# Patient Record
Sex: Female | Born: 1970 | Race: Black or African American | Hispanic: No | Marital: Married
Health system: Southern US, Community
[De-identification: ages and names within clinical notes are randomized; demographics above are authoritative.]

---

## 2020-02-25 ENCOUNTER — Emergency Department (HOSPITAL_COMMUNITY)
Admission: EM | Admit: 2020-02-25 | Discharge: 2020-02-26 | Disposition: A | Discharge status: Home / Self Care | Payer: 59 | Attending: Emergency Medicine | Admitting: Emergency Medicine

## 2020-02-25 ENCOUNTER — Other Ambulatory Visit: Payer: Self-pay

## 2020-02-25 ENCOUNTER — Emergency Department (HOSPITAL_COMMUNITY): Payer: 59

## 2020-02-25 DIAGNOSIS — R1032 Left lower quadrant pain: Secondary | ICD-10-CM | POA: Diagnosis present

## 2020-02-25 DIAGNOSIS — K529 Noninfective gastroenteritis and colitis, unspecified: Secondary | ICD-10-CM | POA: Diagnosis not present

## 2020-02-25 LAB — LIPASE, BLOOD: Lipase: 18 U/L (ref 11–51)

## 2020-02-25 LAB — COMPREHENSIVE METABOLIC PANEL
ALT: 11 U/L (ref 0–44)
AST: 16 U/L (ref 15–41)
Albumin: 4.3 g/dL (ref 3.5–5.0)
Alkaline Phosphatase: 62 U/L (ref 38–126)
Anion gap: 10 (ref 5–15)
BUN: 7 mg/dL (ref 6–20)
CO2: 25 mmol/L (ref 22–32)
Calcium: 9.8 mg/dL (ref 8.9–10.3)
Chloride: 105 mmol/L (ref 98–111)
Creatinine, Ser: 0.92 mg/dL (ref 0.44–1.00)
GFR calc Af Amer: >60 mL/min (ref >60)
GFR calc non Af Amer: >60 mL/min (ref >60)
Glucose, Bld: 116 mg/dL — ABNORMAL HIGH (ref 70–99)
Potassium: 3.7 mmol/L (ref 3.5–5.1)
Sodium: 140 mmol/L (ref 135–145)
Total Bilirubin: 0.4 mg/dL (ref 0.3–1.2)
Total Protein: 7.5 g/dL (ref 6.5–8.1)

## 2020-02-25 LAB — CBC
HCT: 44.5 % (ref 36.0–46.0)
Hemoglobin: 14 g/dL (ref 12.0–15.0)
MCH: 29.4 pg (ref 26.0–34.0)
MCHC: 31.5 g/dL (ref 30.0–36.0)
MCV: 93.5 fL (ref 80.0–100.0)
Platelets: 331 10*3/uL (ref 150–400)
RBC: 4.76 MIL/uL (ref 3.87–5.11)
RDW: 13 % (ref 11.5–15.5)
WBC: 10.8 10*3/uL — ABNORMAL HIGH (ref 4.0–10.5)
nRBC: 0 % (ref 0.0–0.2)

## 2020-02-25 MED ORDER — LACTATED RINGERS IV BOLUS
1000.0000 mL | Freq: Once | INTRAVENOUS | Status: AC
  Administered 2020-02-25: 1000 mL via INTRAVENOUS

## 2020-02-25 MED ORDER — SODIUM CHLORIDE 0.9% FLUSH: 3.0000 mL | Freq: Once | INTRAVENOUS | Status: DC

## 2020-02-25 MED ORDER — DIPHENHYDRAMINE HCL 50 MG/ML IJ SOLN
25.0000 mg | Freq: Once | INTRAMUSCULAR | Status: AC
  Administered 2020-02-25: 25 mg via INTRAVENOUS
  Filled 2020-02-25: qty 1

## 2020-02-25 MED ORDER — IOHEXOL 300 MG/ML  SOLN
100.0000 mL | Freq: Once | INTRAMUSCULAR | Status: AC | PRN
  Administered 2020-02-25: 100 mL via INTRAVENOUS

## 2020-02-25 MED ORDER — ONDANSETRON HCL 4 MG/2ML IJ SOLN: 4.0000 mg | Freq: Once | INTRAMUSCULAR | Status: DC

## 2020-02-25 MED ORDER — DROPERIDOL 2.5 MG/ML IJ SOLN
1.2500 mg | Freq: Once | INTRAMUSCULAR | Status: AC
  Administered 2020-02-25: 1.25 mg via INTRAVENOUS
  Filled 2020-02-25: qty 2

## 2020-02-25 MED ORDER — DICYCLOMINE HCL 10 MG PO CAPS
10.0000 mg | ORAL_CAPSULE | Freq: Once | ORAL | Status: AC
  Administered 2020-02-25: 10 mg via ORAL
  Filled 2020-02-25: qty 1

## 2020-02-25 MED ORDER — PROMETHAZINE HCL 25 MG PO TABS
25.0000 mg | ORAL_TABLET | Freq: Three times a day (TID) | ORAL | 0 refills | Status: AC | PRN
Start: 2020-02-25 — End: ?

## 2020-02-25 NOTE — ED Notes (Signed)
Pt returned from CT °

## 2020-02-25 NOTE — ED Provider Notes (Signed)
[facility] EMERGENCY DEPARTMENT Provider Note   CSN: [id number] Arrival date & time: 02/25/20  1222     History Chief Complaint  Patient presents with  . Abdominal Pain  . Diarrhea    Alexandra Brown is a 49 y.o. female.  The history is provided by the patient and medical records. No language interpreter was used.  Abdominal Pain Associated symptoms: diarrhea   Diarrhea Associated symptoms: abdominal pain    Alexandra Brown is a 49 y.o. female who presents to the Emergency Department complaining of abdominal pain and diarrhea. She presents the emergency department complaining of abdominal pain, vomiting and diarrhea that started about two days ago. She lives in [place] and has specialist therefore lupus and colitis. She is currently on medications for chronic pain as well as bentyl and promethazine PRN. She is in the region visiting her family and caring for her grandchildren. Two days ago she developed cramping left lower quadrant abdominal pain with associated nausea. Yesterday she developed numerous episodes of emesis and numerous episodes of diarrhea. No reports of fevers, dysuria. She states that this is similar to prior flareups of her episodes. She states that she is hospitalized about six times a year for recurrent episodes. Her usual hospitalization consists of IV fluids, antiemetics and pain medications. 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 She does not smoke, drink alcohol, use drugs.    No past medical history on file. Lupus, colitis  There are no problems to display for this patient.      OB History   No obstetric history on file.     No family history on file.  Social History   Tobacco Use  . Smoking status: Not on file  Substance Use Topics  . Alcohol use: Not on file  . Drug use: Not on file    Home Medications Prior to Admission medications   Not on File    Allergies    Patient has no allergy information on  record.  Review of Systems   Review of Systems  Gastrointestinal: Positive for abdominal pain and diarrhea.  All other systems reviewed and are negative.   Physical Exam Updated Vital Signs BP (!) 113/59 (BP Location: Right Arm)   Pulse 96   Temp 99.3 F (37.4 C) (Oral)   Resp 18   Ht 5\' 2"  (1.575 m)   Wt 59 kg   SpO2 100%   BMI 23.78 kg/m   Physical Exam Vitals and nursing note reviewed.  Constitutional:      Appearance: She is well-developed.  HENT:     Head: Normocephalic and atraumatic.  Cardiovascular:     Rate and Rhythm: Normal rate and regular rhythm.     Heart sounds: No murmur heard.   Pulmonary:     Effort: Pulmonary effort is normal. No respiratory distress.     Breath sounds: Normal breath sounds.  Abdominal:     Palpations: Abdomen is soft.     Tenderness: There is abdominal tenderness. There is no guarding or rebound.     Comments: Mild to moderate generalized abdominal tenderness  Musculoskeletal:        General: No tenderness.  Skin:    General: Skin is warm and dry.  Neurological:     Mental Status: She is alert and oriented to person, place, and time.  Psychiatric:        Behavior: Behavior normal.     ED Results / Procedures / Treatments   Labs (  all labs ordered are listed, but only abnormal results are displayed) Labs Reviewed  COMPREHENSIVE METABOLIC PANEL - Abnormal; Notable for the following components:      Result Value   Glucose, Bld 116 (*)    All other components within normal limits  CBC - Abnormal; Notable for the following components:   WBC 10.8 (*)    All other components within normal limits  LIPASE, BLOOD  URINALYSIS, ROUTINE W REFLEX MICROSCOPIC    EKG None  Radiology No results found.  Procedures Procedures (including critical care time)  Medications Ordered in ED Medications  sodium chloride flush (NS) 0.9 % injection 3 mL (has no administration in time range)  lactated ringers bolus 1,000 mL (has no  administration in time range)  dicyclomine (BENTYL) capsule 10 mg (has no administration in time range)  droperidol (INAPSINE) 2.5 MG/ML injection 1.25 mg (has no administration in time range)    ED Course  I have reviewed the triage vital signs and the nursing notes.  Pertinent labs & imaging results that were available during my care of the patient were reviewed by me and considered in my medical decision making (see chart for details).    MDM Rules/Calculators/A&P                         Patient with history of colitis here for evaluation of abdominal pain, vomiting and diarrhea similar to prior failures of her colitis. She does have tenderness without peritoneal findings on initial evaluation. Labs with mild leukocytosis. A CT abdomen pelvis was obtained, which demonstrates findings consistent with colitis.  Records reviewed in care everywhere. Patient does have a history of chronic pain syndrome as well as recurrent colitis with unclear source. Upon multiple evaluations it has been found that colitis is not due to an infectious source. Given her history of recurrent C diff, will not start antibiotics. Patient unable to provide stool sample at this time, making acute C diff infection unlikely. CT does demonstrate some bladder under distention. She has no current urinary symptoms. She did urinate prior to being able to provide a urine sample in the department, feel UTI is unlikely at this point in time she is asymptomatic.  Following treatment with medications in the department and on reassessment she is feeling significantly improved. Plan to discharge home with oral antiemetics, clear liquid diet as well as outpatient follow-up and return precautions.  Final Clinical Impression(s) / ED Diagnoses Final diagnoses:  Colitis    Rx / DC Orders ED Discharge Orders    None       Tilden Fossa, MD 02/26/20 0003

## 2020-02-25 NOTE — ED Triage Notes (Signed)
Patient arrives to ED with complaints of LLQ abdominal pain, vomitng and mucous looking diarrhea for the past 2 days. Patient states she has hx of colitis and this feels the same. Patient is visiting and is from [place].

## 2020-02-25 NOTE — Discharge Instructions (Addendum)
Drink plenty of fluids to stay hydrated. Get rechecked immediately if you develop fevers, worsening symptoms or inability to tolerate oral fluids.  You had a CT scan performed in the emergency department today. The CT scan showed findings of colitis as well as a cyst on your liver and spleen. Please follow-up with your gastroenterologist for repeat evaluation and to see if you need any further imaging.

## 2020-02-25 NOTE — ED Notes (Signed)
Pt to CT via stretcher

## 2020-02-26 NOTE — ED Notes (Signed)
Pt verbalized understanding of d/c instructions, follow up and medications. Pt to WR via WC, NAD.  

## 2020-10-29 IMAGING — CT CT ABD-PELV W/ CM
2 of 5 series · 14 of 46 positions shown, 16 images · IV contrast (APPLIED)
Comparison: None

CLINICAL DATA: History of colitis, appendectomy now with left lower
quadrant abdominal pain vomiting and diarrhea

EXAM:
CT ABDOMEN AND PELVIS WITH CONTRAST
TECHNIQUE: Multidetector CT imaging of the abdomen and pelvis was performed
using the standard protocol following bolus administration of
intravenous contrast.
CONTRAST:  100mL OMNIPAQUE IOHEXOL 300 MG/ML  SOLN

[Series 3: abdomen 5.0 · axial · 0.70mm/px · z∈[-391,+9]mm · 11 of 94 slices shown, 13 images]
[im 7/94  soft-tissue]
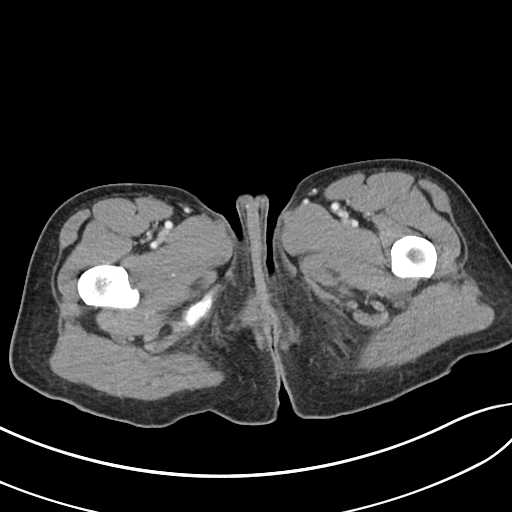
[im 7/94  bone]
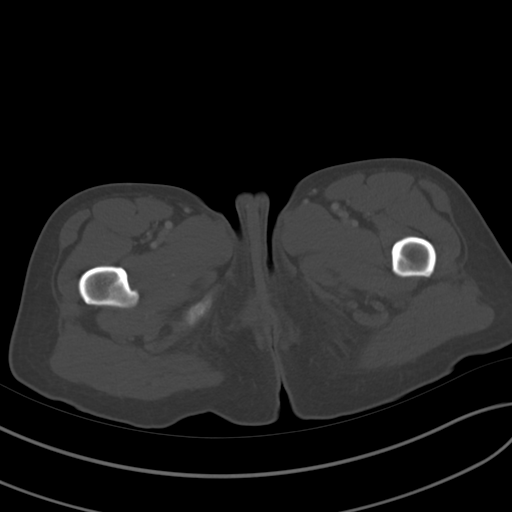
[im 13/94  soft-tissue]
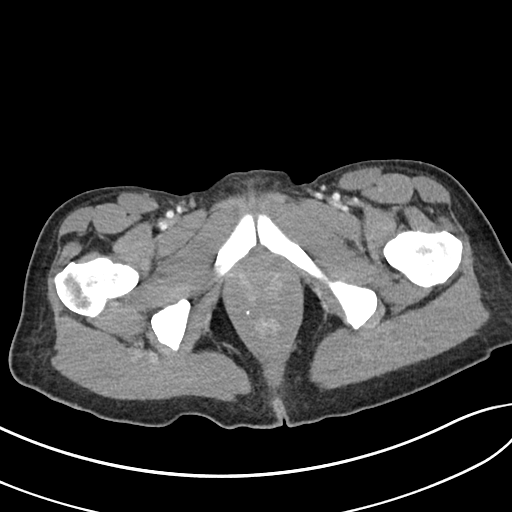
[im 25/94  soft-tissue]
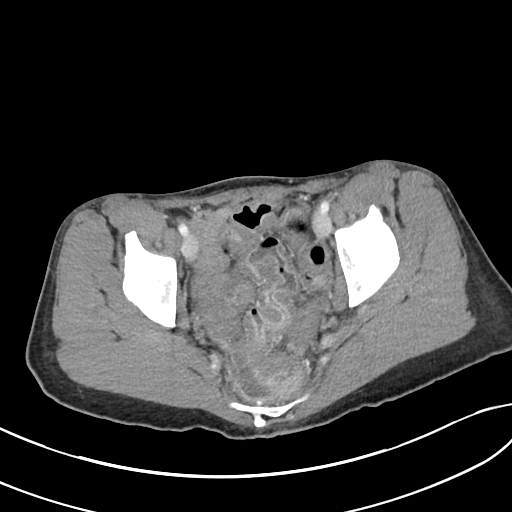
[im 32/94  soft-tissue]
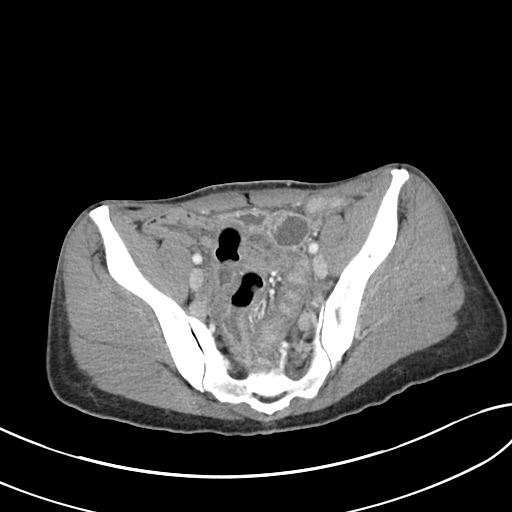
[im 38/94  soft-tissue]
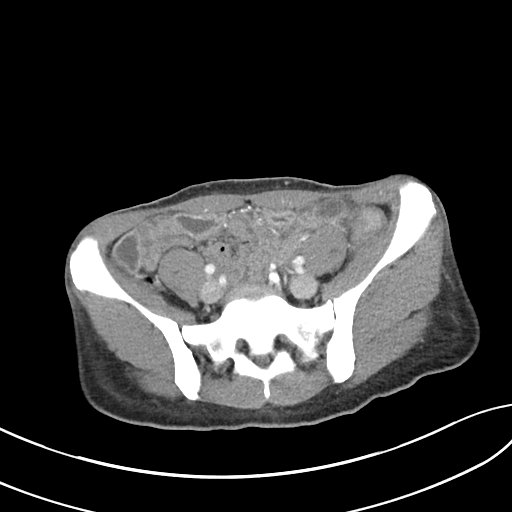
[im 50/94  soft-tissue]
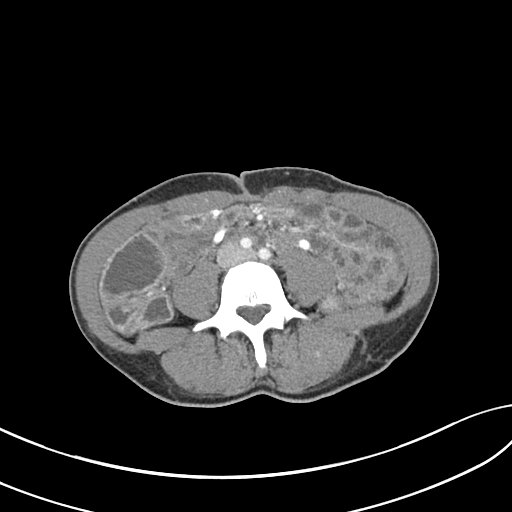
[im 56/94  soft-tissue]
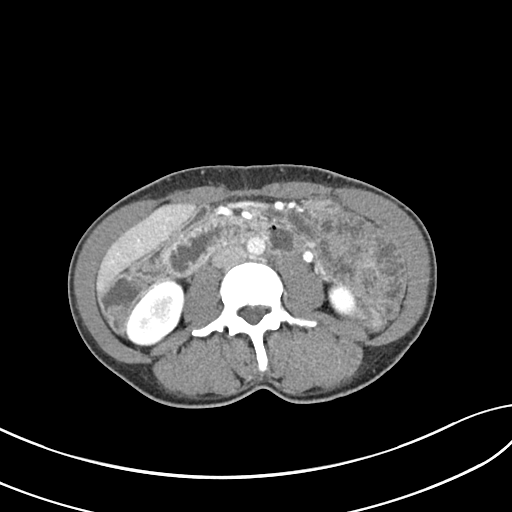
[im 63/94  soft-tissue]
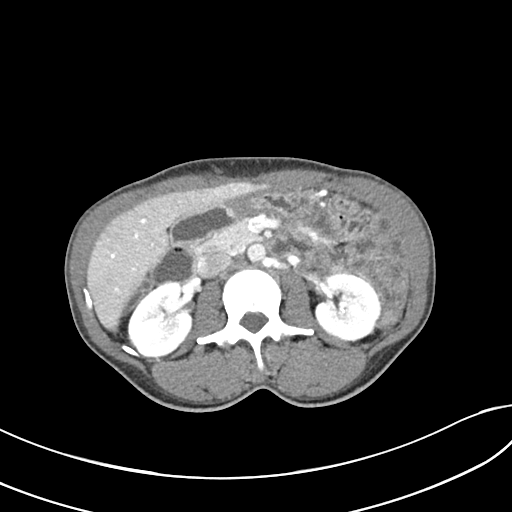
[im 69/94  soft-tissue]
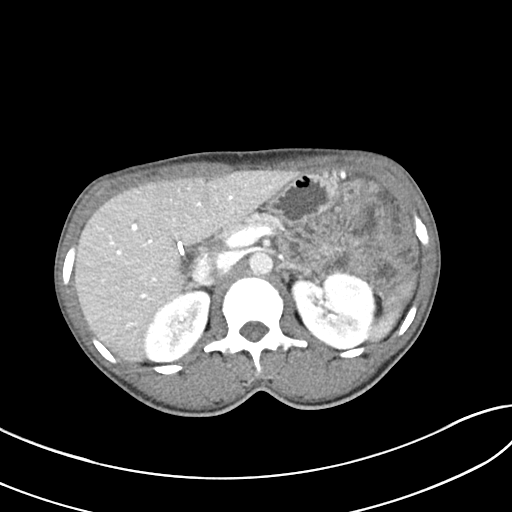
[im 69/94  bone]
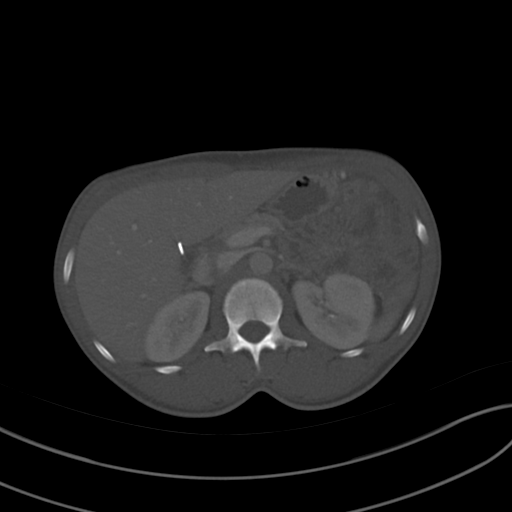
[im 81/94  soft-tissue]
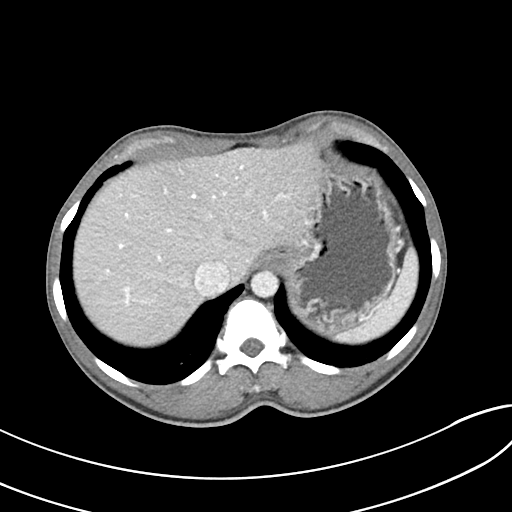
[im 87/94  soft-tissue]
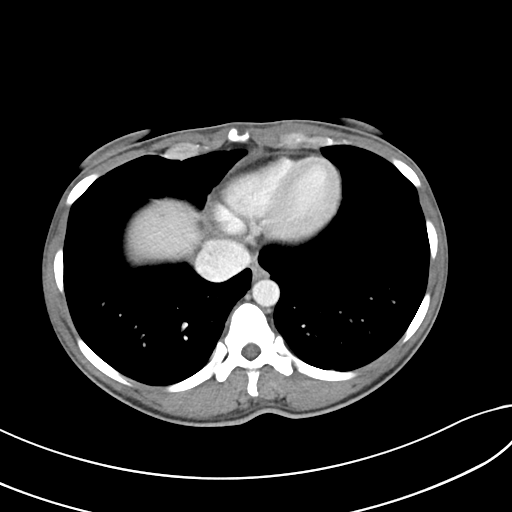

[Series 6: abdomen 3.0 mpr cor · coronal · 0.62mm/px · 3 of 77 slices shown]
[im 26/77  soft-tissue]
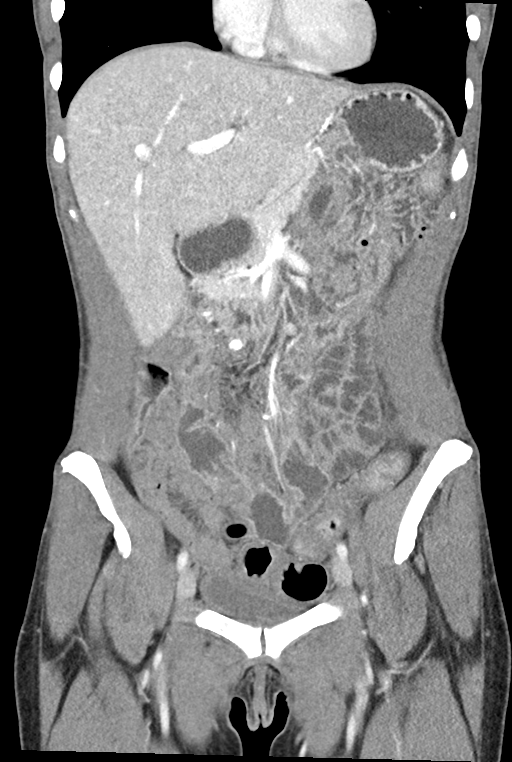
[im 34/77  soft-tissue]
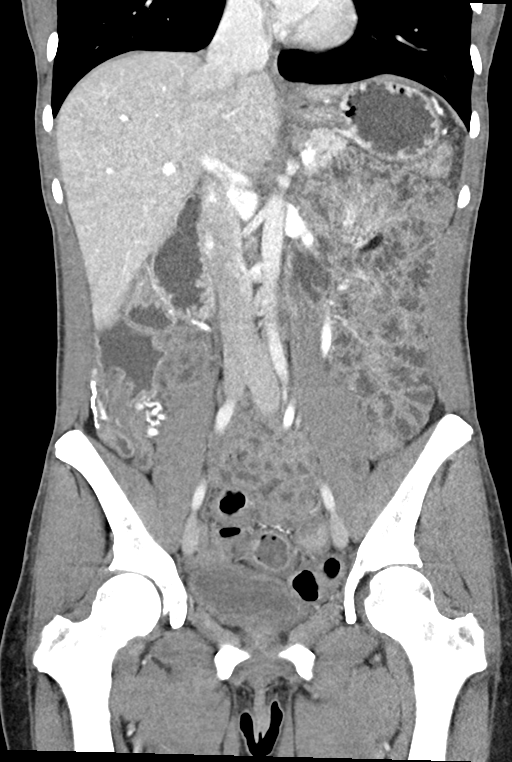
[im 43/77  soft-tissue]
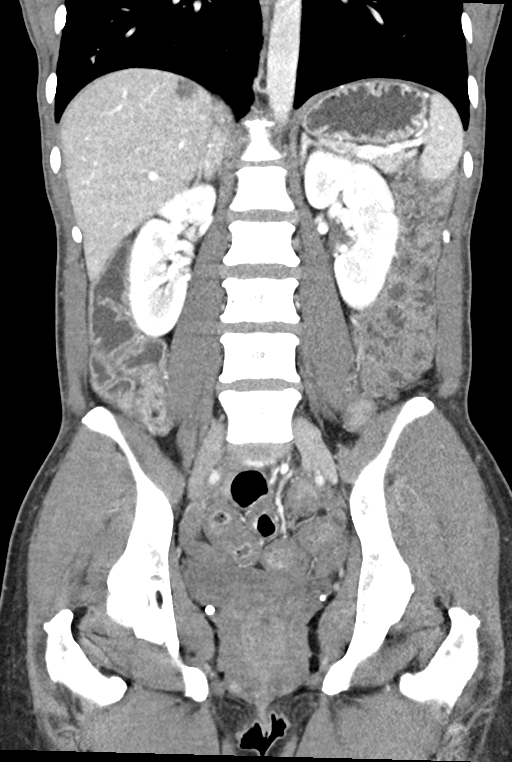

[14 of 46 positions shown; findings below may reference images not displayed]

FINDINGS: Lower chest: Atelectatic changes within the otherwise clear lung
bases. Normal heart size. No pericardial effusion.

Hepatobiliary: Indeterminate intermediate attenuation (~50 HU)
cm lesion the posterior right lobe liver ([DATE]). Additional
subcentimeter hypertension foci in the liver too small to fully
characterize on CT imaging but statistically likely benign. No other
focal liver lesions. Patient is post cholecystectomy. No calcified
intraductal gallstones.

Pancreas: Unremarkable. No pancreatic ductal dilatation or
surrounding inflammatory changes.

Spleen: 1.4 cm hypoattenuating structure in the spleen without
concerning or aggressive features. Normal splenic size.

Adrenals/Urinary Tract: Normal adrenal glands. Kidneys are normally
located with symmetric enhancement and excretion. No suspicious
renal lesion, urolithiasis or hydronephrosis. Urinary bladder is
largely decompressed at the time of exam and therefore poorly
evaluated by CT imaging. Mild bladder wall thickening may be related
to underdistention.

Stomach/Bowel: Slight mucosal hyperemia of the stomach with
prominent rugal folds. Similar mucosal prominence in the duodenum
and throughout the small bowel with a diffusely fluid-filled
appearance of the small bowel itself. The appendix is surgically
absent. Mild diffuse pancolonic edematous mural thickening with
mucosal hyperemia as well. Lack of formed stool with fluid
throughout the colon.

Vascular/Lymphatic: Difficult assessment of the abdominal lymph
nodes given a paucity of intraperitoneal fat. No convincing
adenopathy. No aortic aneurysm or ectasia or other significant
vascular findings.

Reproductive: Uterus appears surgically absent. No concerning
adnexal lesions.

Other: Paucity of intraperitoneal fat, as above. No abdominopelvic
free air or fluid. No bowel containing hernias. Mild posterior body
wall edema.

Musculoskeletal: No acute osseous abnormality or suspicious osseous
lesion.
IMPRESSION: 1. Diffusely fluid-filled appearance of both large and small bowel
as well as the stomach and duodenum with more pronounced pancolonic
edematous mural thickening and mucosal hyperemia. Findings are
suggestive of an infectious or inflammatory enterocolitis.
2. Indeterminate intermediate attenuation 1.3 cm lesion the
posterior right lobe liver. Recommend further characterization with
nonemergent, outpatient liver MRI. This recommendation follows ACR
consensus guidelines: Management of Incidental Liver Lesions on CT:
A White Paper of the ACR Incidental Findings Committee. [HOSPITAL] 5255; 14:1338-1354.
3. 1.4 cm hypoattenuating structure in the spleen without concerning
or aggressive features. Favor benign etiology such as a hemangioma
in the absence of known malignancy. Will also be better visualized
the time of abdominal MRI recommended above. This recommendation
follows ACR consensus guidelines: White Paper of the ACR Incidental
Findings Committee II on Splenic and Nodal Findings. [HOSPITAL] 7247;[DATE].
4. Mild bladder wall thickening may be related to underdistention.
Recommend correlation with urinalysis to exclude cystitis.
